# Patient Record
Sex: Male | Born: 2005 | Race: White | Hispanic: Yes | Marital: Single | Smoking: Never smoker
Health system: Southern US, Community
[De-identification: ages and names within clinical notes are randomized; demographics above are authoritative.]

## PROBLEM LIST (undated history)

## (undated) DIAGNOSIS — B958 Unspecified staphylococcus as the cause of diseases classified elsewhere: Secondary | ICD-10-CM

---
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
## 2024-02-17 ENCOUNTER — Ambulatory Visit (HOSPITAL_COMMUNITY)
Admission: EM | Admit: 2024-02-17 | Discharge: 2024-02-17 | Disposition: A | Discharge status: Home / Self Care | Attending: Emergency Medicine | Admitting: Emergency Medicine

## 2024-02-17 ENCOUNTER — Encounter (HOSPITAL_COMMUNITY): Payer: Self-pay

## 2024-02-17 DIAGNOSIS — L089 Local infection of the skin and subcutaneous tissue, unspecified: Secondary | ICD-10-CM

## 2024-02-17 DIAGNOSIS — T148XXA Other injury of unspecified body region, initial encounter: Secondary | ICD-10-CM

## 2024-02-17 HISTORY — DX: Unspecified staphylococcus as the cause of diseases classified elsewhere: B95.8

## 2024-02-17 MED ORDER — SULFAMETHOXAZOLE-TRIMETHOPRIM 800-160 MG PO TABS: 1.0000 | ORAL_TABLET | Freq: Two times a day (BID) | ORAL | 0 refills | Status: AC

## 2024-02-17 MED ORDER — MUPIROCIN 2 % EX OINT: 1.0000 | TOPICAL_OINTMENT | Freq: Two times a day (BID) | CUTANEOUS | 0 refills | Status: AC

## 2024-02-17 NOTE — ED Triage Notes (Signed)
 Patient presenting with a possible infection of the left leg thigh area. First noticed a few days ago. States the area looks like an open cut. States he was scratching the area, unsure how he injured it.

## 2024-02-17 NOTE — Discharge Instructions (Signed)
 Keep the wound clean and dry.  Keep it covered during the day so it does not rub against your pants.  Take the oral antibiotics twice daily with food for the next 5 days, this will cover for MRSA.  Avoid scratching, itching or picking at the wound.  Pain and swelling should improve over the next 3 days or so on antibiotics.  If no improvement or any changes please return to clinic for reevaluation.

## 2024-02-17 NOTE — ED Provider Notes (Signed)
 [facility]    CSN: [id number] Arrival date & time: 02/17/24  1459      History   Chief Complaint Chief Complaint  Patient presents with   Wound Check    HPI Edward Coleman is a 18 y.o. male.   Patient declined language interpreter, is able to speak English.  Patient concerned about an infection of the left outer thigh.  Noticed the area a few days ago, it was itchy and painful.  Feels like there is a cut at the site, unsure if he got bit or what the area was initially.  Was scratching at the area.  Area is warm, continues to grow and is swollen.  Patient is concerned because he has had staph in the past.  Has not had any drainage from the site and has not had any fevers.  The history is provided by the patient and medical records.  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 Authorizing Provider  mupirocin ointment (BACTROBAN) 2 % Apply 1 Application topically 2 (two) times daily. 02/17/24  Yes Elias Bordner  N, FNP  sulfamethoxazole-trimethoprim (BACTRIM DS) 800-160 MG tablet Take 1 tablet by mouth 2 (two) times daily for 5 days. 02/17/24 02/22/24 Yes Dreama, Irvin Lizama  N, FNP    Family History History reviewed. No pertinent family history.  Social History Social History   Tobacco Use   Smoking status:  Never   Smokeless tobacco: Never  Vaping Use   Vaping status: Never Used  Substance Use Topics   Alcohol use: Never   Drug use: Never     Allergies   Patient has no known allergies.   Review of Systems Review of Systems  Per HPI  Physical Exam Triage Vital Signs ED Triage Vitals  Encounter Vitals Group     BP 02/17/24 1636 (!) 122/59     Girls Systolic BP Percentile --      Girls Diastolic BP Percentile --      Boys Systolic BP Percentile --      Boys Diastolic BP Percentile --      Pulse Rate 02/17/24 1636 69     Resp 02/17/24 1636 16     Temp 02/17/24 1636 98.2 F (36.8 C)     Temp Source 02/17/24 1636 Oral     SpO2 02/17/24 1636 96 %     Weight 02/17/24 1635 170 lb (77.1 kg)     Height 02/17/24 1635 5' 8.5 (1.74 m)     Head Circumference --      Peak Flow --      Pain Score 02/17/24 1634 5     Pain Loc --      Pain Education --      Exclude from Growth Chart --    No data found.  Updated Vital Signs BP (!) 122/59 (  BP Location: Right Arm)   Pulse 69   Temp 98.2 F (36.8 C) (Oral)   Resp 16   Ht 5' 8.5 (1.74 m)   Wt 170 lb (77.1 kg)   SpO2 96%   BMI 25.47 kg/m   Visual Acuity Right Eye Distance:   Left Eye Distance:   Bilateral Distance:    Right Eye Near:   Left Eye Near:    Bilateral Near:     Physical Exam Vitals and nursing note reviewed.  Constitutional:      Appearance: Normal appearance.  HENT:     Head: Normocephalic and atraumatic.     Right Ear: External ear normal.     Left Ear: External ear normal.     Nose: Nose normal.     Mouth/Throat:     Mouth: Mucous membranes are moist.  Eyes:     Conjunctiva/sclera: Conjunctivae normal.  Cardiovascular:     Rate and Rhythm: Normal rate.  Pulmonary:     Effort: Pulmonary effort is normal. No respiratory distress.  Musculoskeletal:        General: Normal range of motion.  Skin:    General: Skin is warm and dry.         Comments: Erythematous firm area to the left outer thigh  3 cm x 2 cm Overlying skin with appearance of abrasion Some follicles near the area are inflamed  Neurological:     General: No focal deficit present.     Mental Status: He is alert and oriented to person, place, and time.  Psychiatric:        Mood and Affect: Mood normal.        Behavior: Behavior normal. Behavior is cooperative.      UC Treatments / Results  Labs (all labs ordered are listed, but only abnormal results are displayed) Labs Reviewed - No data to display  EKG   Radiology No results found.  Procedures Procedures (including critical care time)  Medications Ordered in UC Medications - No data to display  Initial Impression / Assessment and Plan / UC Course  I have reviewed the triage vital signs and the nursing notes.  Pertinent labs & imaging results that were available during my care of the patient were reviewed by me and considered in my medical decision making (see chart for details).  Vitals and triage reviewed, patient is hemodynamically stable.  Erythematous firm area to the left outer thigh concerning for cellulitis.  Will cover against MRSA with Bactrim due to history.  Proper wound care discussed.  Patient is afebrile without tachycardia, low concern for systemic illness at this time.  Plan of care, follow-up care return precautions given, no questions at this time.    Final Clinical Impressions(s) / UC Diagnoses   Final diagnoses:  Wound infection     Discharge Instructions      Keep the wound clean and dry.  Keep it covered during the day so it does not rub against your pants.  Take the oral antibiotics twice daily with food for the next 5 days, this will cover for MRSA.  Avoid scratching, itching or picking at the wound.  Pain and swelling should improve over the next 3 days or so on antibiotics.  If no improvement or any changes please return to clinic for reevaluation.      ED Prescriptions     Medication Sig Dispense Auth.  Provider   sulfamethoxazole-trimethoprim (BACTRIM DS) 800-160 MG tablet Take 1 tablet by mouth 2 (  two) times daily for 5 days. 10 tablet Dreama, Marck Mcclenny  N, FNP   mupirocin ointment (BACTROBAN) 2 % Apply 1 Application topically 2 (two) times daily. 22 g Dreama, Maureen Duesing  N, FNP      PDMP not reviewed this encounter.   Dreama Aubryanna Nesheim  N, FNP 02/17/24 1706
